# Patient Record
Sex: Female | Born: 1950 | Race: White | Hispanic: No | Marital: Married | Smoking: Never smoker
Health system: Southern US, Community
[De-identification: ages and names within clinical notes are randomized; demographics above are authoritative.]

---

## 2003-02-04 ENCOUNTER — Other Ambulatory Visit: Admission: RE | Admit: 2003-02-04 | Discharge: 2003-02-04 | Payer: Self-pay | Admitting: Obstetrics & Gynecology

## 2004-04-03 ENCOUNTER — Encounter: Admission: RE | Admit: 2004-04-03 | Discharge: 2004-04-03 | Payer: Self-pay | Admitting: Orthopaedic Surgery

## 2004-06-07 ENCOUNTER — Ambulatory Visit (HOSPITAL_COMMUNITY): Admission: RE | Admit: 2004-06-07 | Discharge: 2004-06-07 | Payer: Self-pay | Admitting: Obstetrics & Gynecology

## 2004-06-07 ENCOUNTER — Encounter (INDEPENDENT_AMBULATORY_CARE_PROVIDER_SITE_OTHER): Payer: Self-pay | Admitting: Specialist

## 2008-08-25 ENCOUNTER — Encounter: Admission: RE | Admit: 2008-08-25 | Discharge: 2008-10-27 | Payer: Self-pay | Admitting: Family Medicine

## 2008-11-24 ENCOUNTER — Encounter: Admission: RE | Admit: 2008-11-24 | Discharge: 2008-11-24 | Payer: Self-pay | Admitting: Family Medicine

## 2010-04-20 IMAGING — CT CT PELVIS W/O CM
3 series · 14 of 36 positions shown, 20 images · non-contrast
Comparison: None

CLINICAL DATA: Right inguinal pain, evaluate for hernia

CT PELVIS WITHOUT CONTRAST
TECHNIQUE: Multidetector CT imaging of the pelvis was performed
following the standard protocol without intravenous contrast.

[Series 3: routine pelvis · axial · 0.70mm/px · z∈[-178,-54]mm · 5 of 39 slices shown, 10 images]
[im 7/39  soft-tissue]
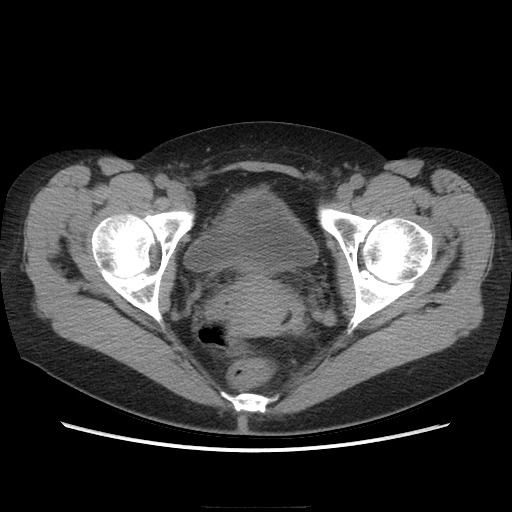
[im 7/39  bone]
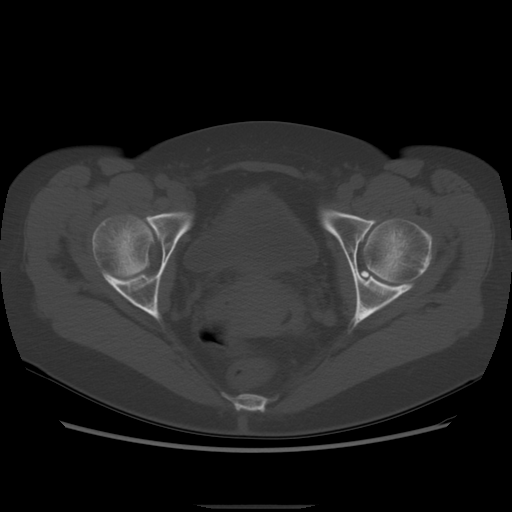
[im 13/39  soft-tissue]
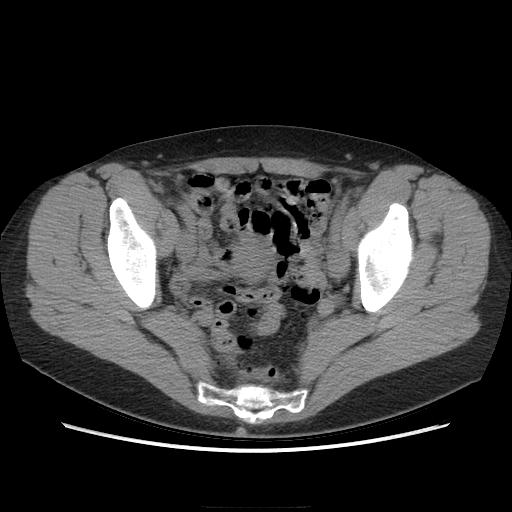
[im 13/39  lung]
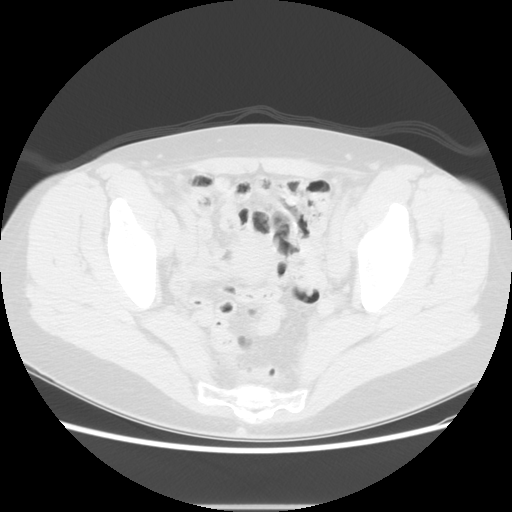
[im 20/39  soft-tissue]
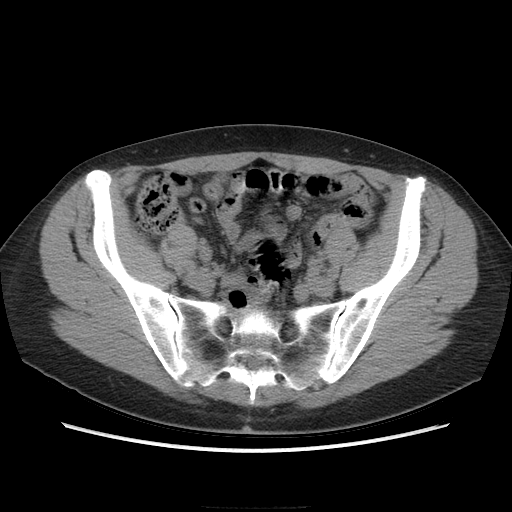
[im 20/39  lung]
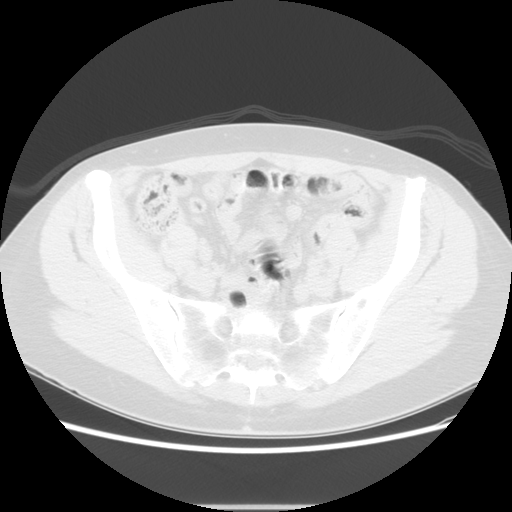
[im 26/39  soft-tissue]
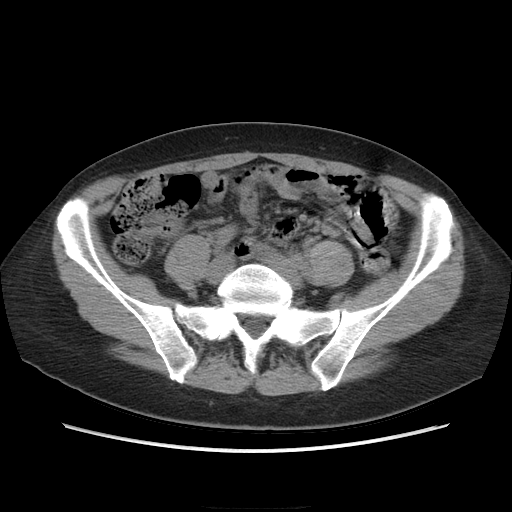
[im 26/39  lung]
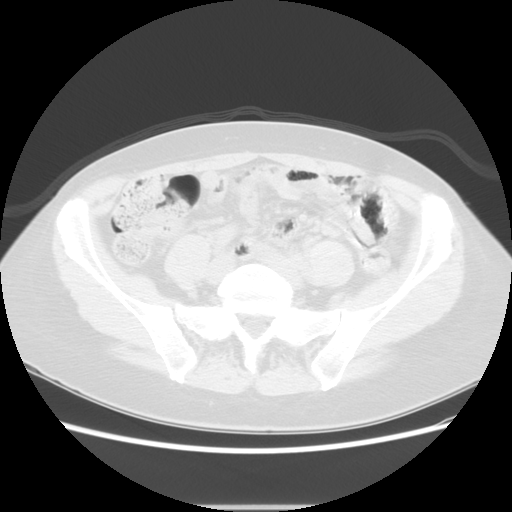
[im 32/39  soft-tissue]
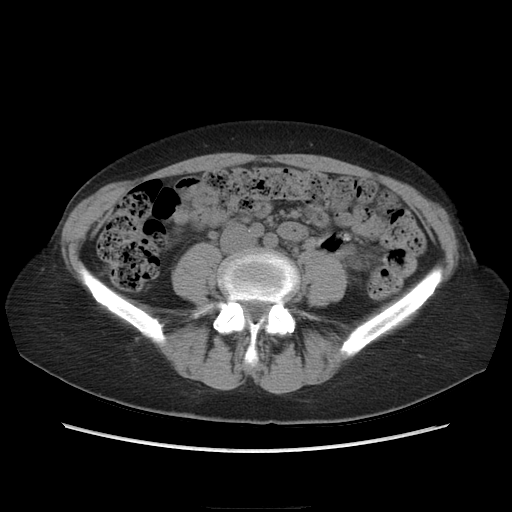
[im 32/39  lung]
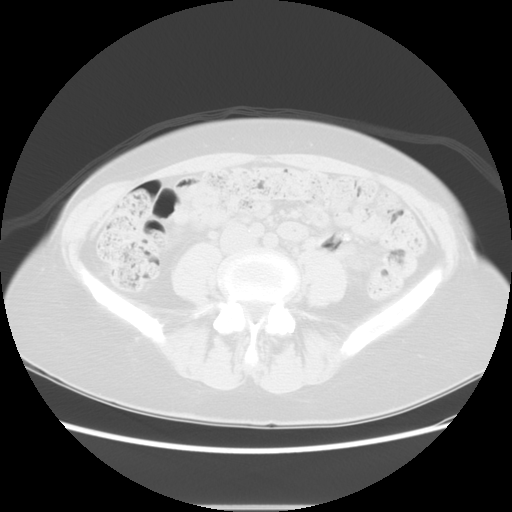

[Series 601: coronal body · coronal · 0.70mm/px · 1 of 113 slices shown, 2 images]
[im 38/113  soft-tissue]
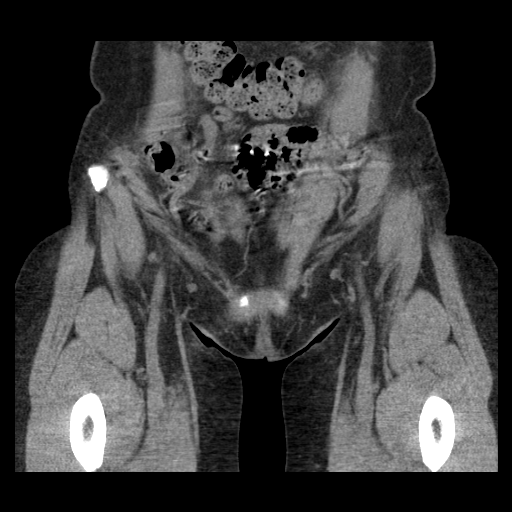
[im 38/113  bone]
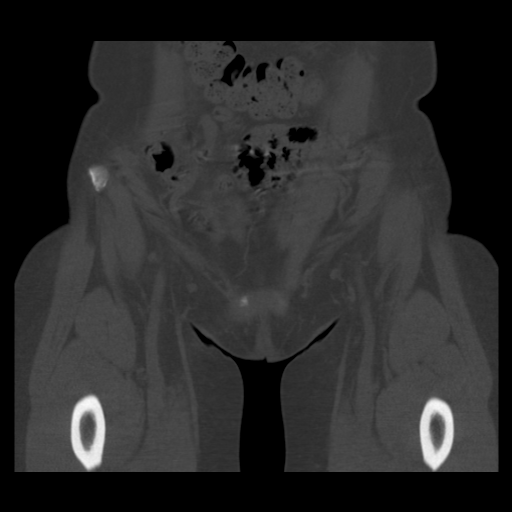

[Series 602: sagittal body · sagittal · 0.70mm/px · 8 of 145 slices shown]
[im 13/145  soft-tissue]
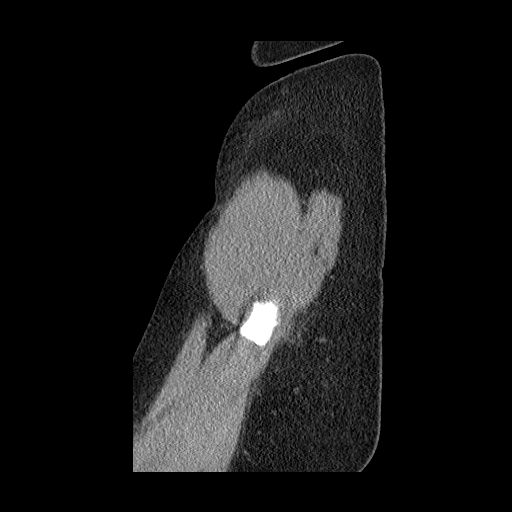
[im 31/145  soft-tissue]
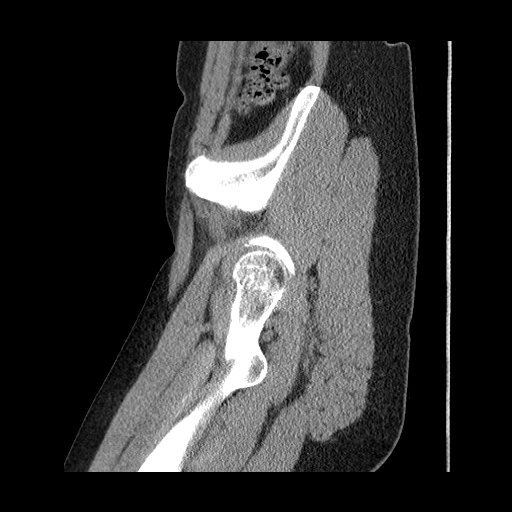
[im 49/145  soft-tissue]
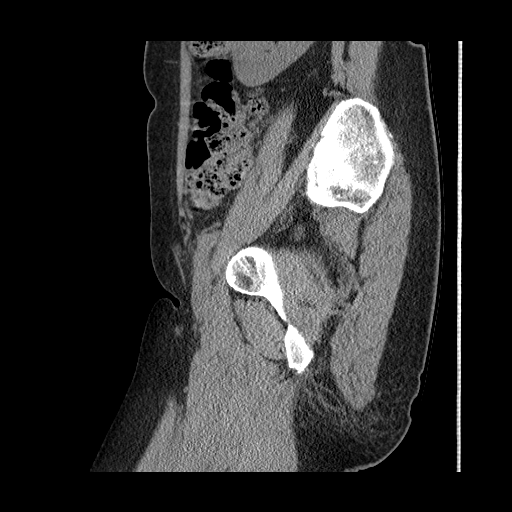
[im 67/145  soft-tissue]
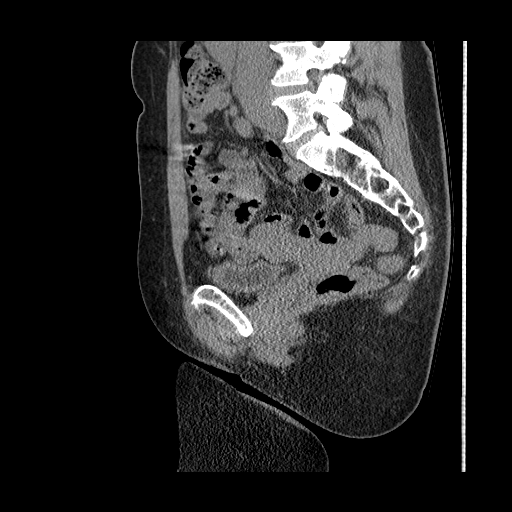
[im 79/145  soft-tissue]
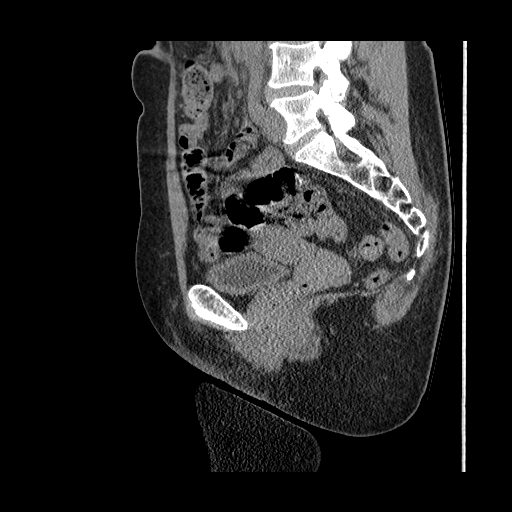
[im 97/145  soft-tissue]
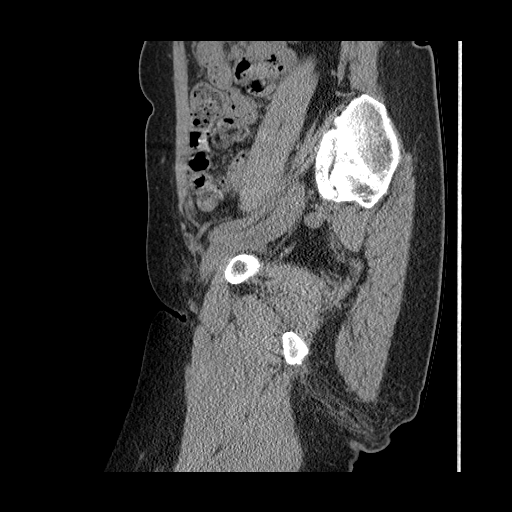
[im 115/145  soft-tissue]
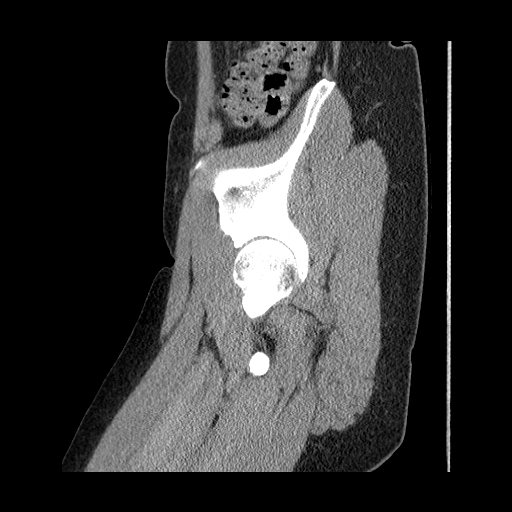
[im 133/145  soft-tissue]
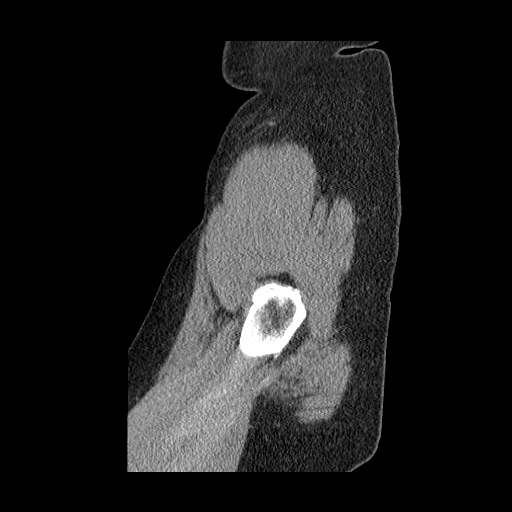

[14 of 36 positions shown; findings below may reference images not displayed]

FINDINGS: On this unenhanced study, there is no evidence of
inguinal hernia.  The urinary bladder is not well distended but is
unremarkable.  The uterus appears normal in size.  No adnexal
lesion is seen and no fluid is noted within the pelvis.  The
terminal ileum appears normal. The appendix appears normal.
IMPRESSION: No evidence of hernia is seen.  No pelvic mass, fluid, or
adenopathy is noted.

## 2010-12-01 NOTE — Op Note (Signed)
NAMELAYKEN, BEG               ACCOUNT NO.:  [id number]   MEDICAL RECORD NO.:  [id number]          PATIENT TYPE:  AMB   LOCATION:  [facility]                           FACILITY:  [facility]   PHYSICIAN:  Genia Del, M.D.DATE OF BIRTH:  [birth year]   DATE OF PROCEDURE:  06/07/2004  DATE OF DISCHARGE:                                 OPERATIVE REPORT   PREOPERATIVE DIAGNOSES:  Perimenopausal irregular bleeding with intrauterine  lesion.   POSTOPERATIVE DIAGNOSES:  Perimenopausal irregular bleeding with  intrauterine lesion.   INTERVENTION:  Hysteroscopy with resection and dilatation and curettage.   SURGEON:  Genia Del, M.D.   ANESTHESIOLOGIST:  Octaviano Glow. Pamalee Leyden, M.D.   DESCRIPTION OF PROCEDURE:  Under MAC analgesia, the patient is in lithotomy  position, she is prepped in the suprapubic, vulvar and vaginal areas with  Betadine and draped as usual. The vaginal exam reveals an anteverted uterus,  no adnexal mass. The speculum is inserted, the anterior lip of the cervix is  grasped with a tenaculum, a paracervical block is done with Nesacaine 1%  with bicarb, 20 mL total at 4 o'clock and 8 o'clock.  We then proceed with  dilatation with Hegar dilators up to 20 easily. We do a diagnostic  hysteroscopy which reveals and anterior wall lesion about 1 cm compatible  with an endometrial polyp. No other lesion is seen. The diagnostic  hysteroscope is removed. We complete dilatation at Hegar dilator #35 easily.  We then insert the operative hysteroscope. The intrauterine cavity is  inspected again.  The intrauterine lesion is visualized and resected with  the double loop. It is sent to pathology. No other lesion is present. A  picture is taken of the intrauterine lesion and then another picture is  taken after resection.  Hemostasis is completed with coagulation with the  loop. We then remove the hysteroscope, proceed with a curettage of the  intrauterine cavity on all surfaces with a  sharp curette. The endometrial  curettings are sent to pathology.  Hemostasis is adequate. We therefore  remove all instruments inside the vagina. The estimated blood loss was less  than 50 mL.  No complication occurred and the patient was transferred to the  recovery room in good status.      ML/MEDQ  D:  06/07/2004  T:  06/07/2004  Job:  [id number]

## 2011-03-22 ENCOUNTER — Other Ambulatory Visit: Payer: Self-pay | Admitting: Family Medicine

## 2011-03-22 ENCOUNTER — Ambulatory Visit
Admission: RE | Admit: 2011-03-22 | Discharge: 2011-03-22 | Disposition: A | Discharge status: Home / Self Care | Payer: BC Managed Care – PPO | Source: Ambulatory Visit | Attending: Family Medicine | Admitting: Family Medicine

## 2011-03-22 DIAGNOSIS — M79671 Pain in right foot: Secondary | ICD-10-CM

## 2012-08-15 IMAGING — CR DG FOOT COMPLETE 3+V*R*
3 series · 3 of 3 positions shown · non-contrast
Comparison: None.

CLINICAL DATA: Dropped weight on foot yesterday with pain

RIGHT FOOT COMPLETE - 3+ VIEW

[view not recorded (1 of 3)]
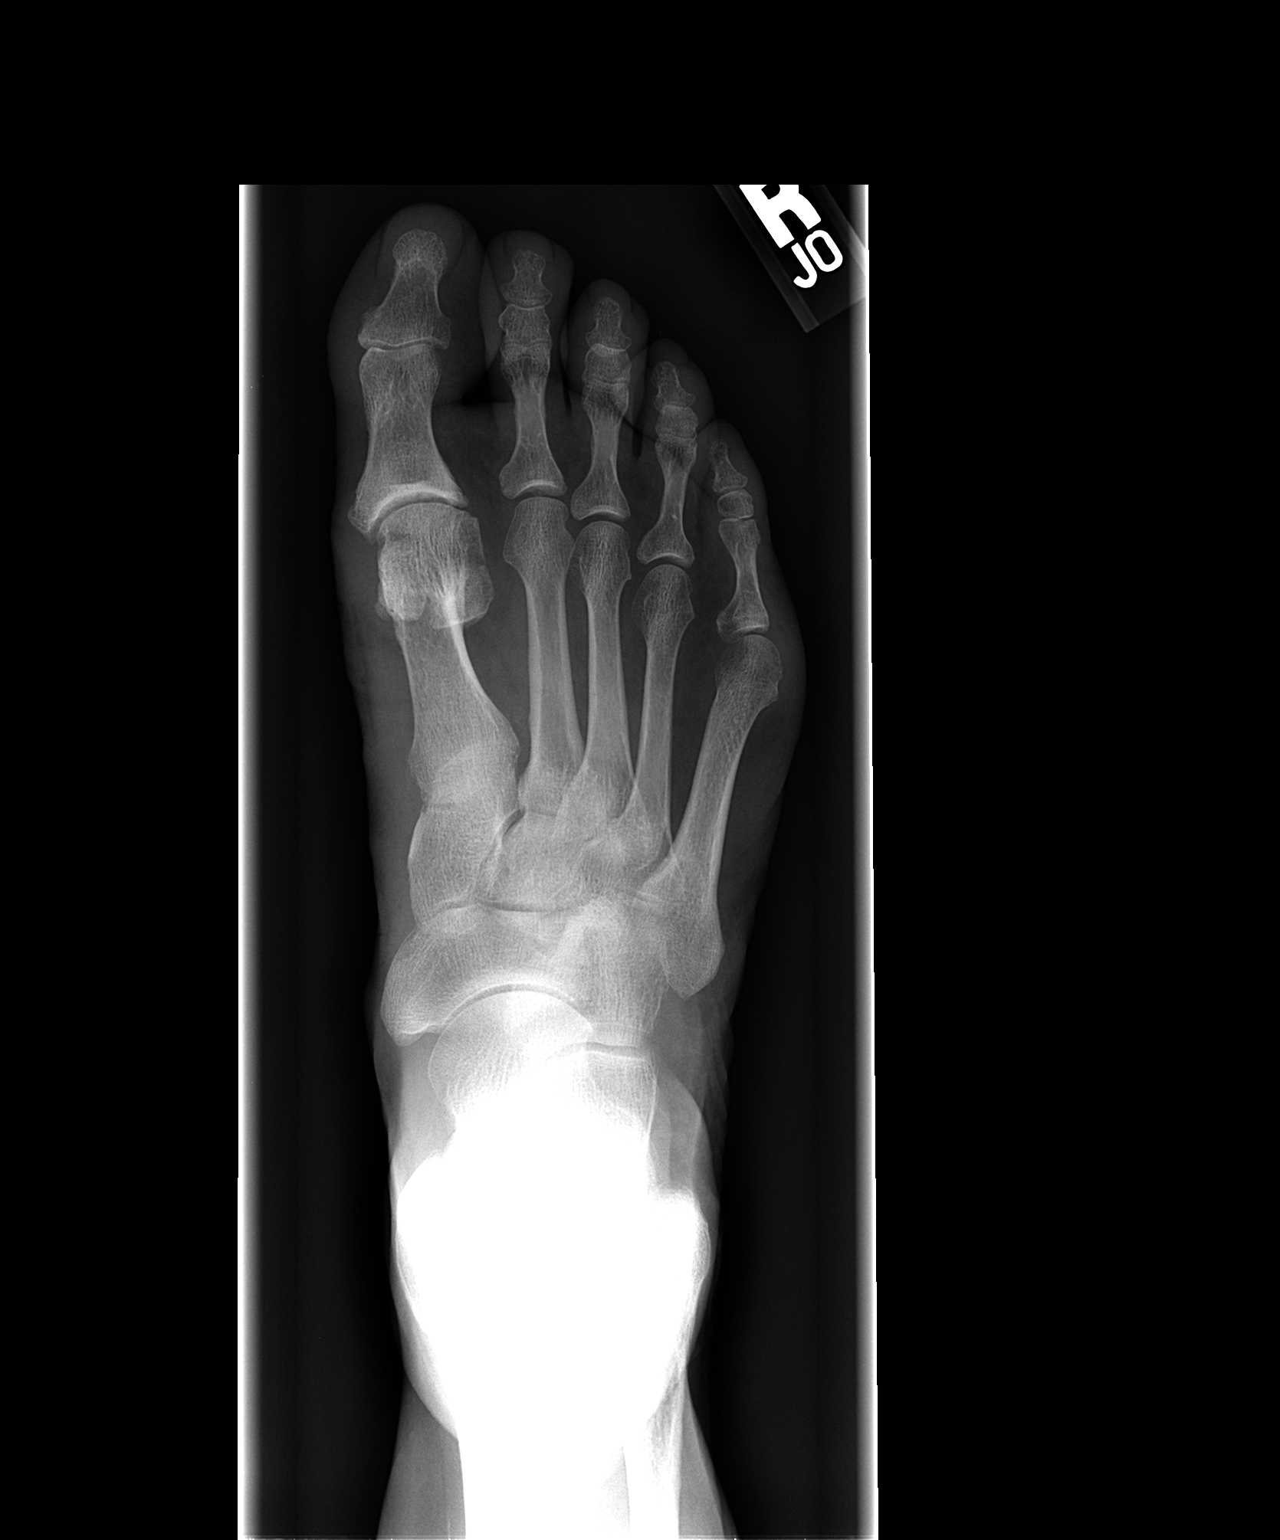

[view not recorded (2 of 3)]
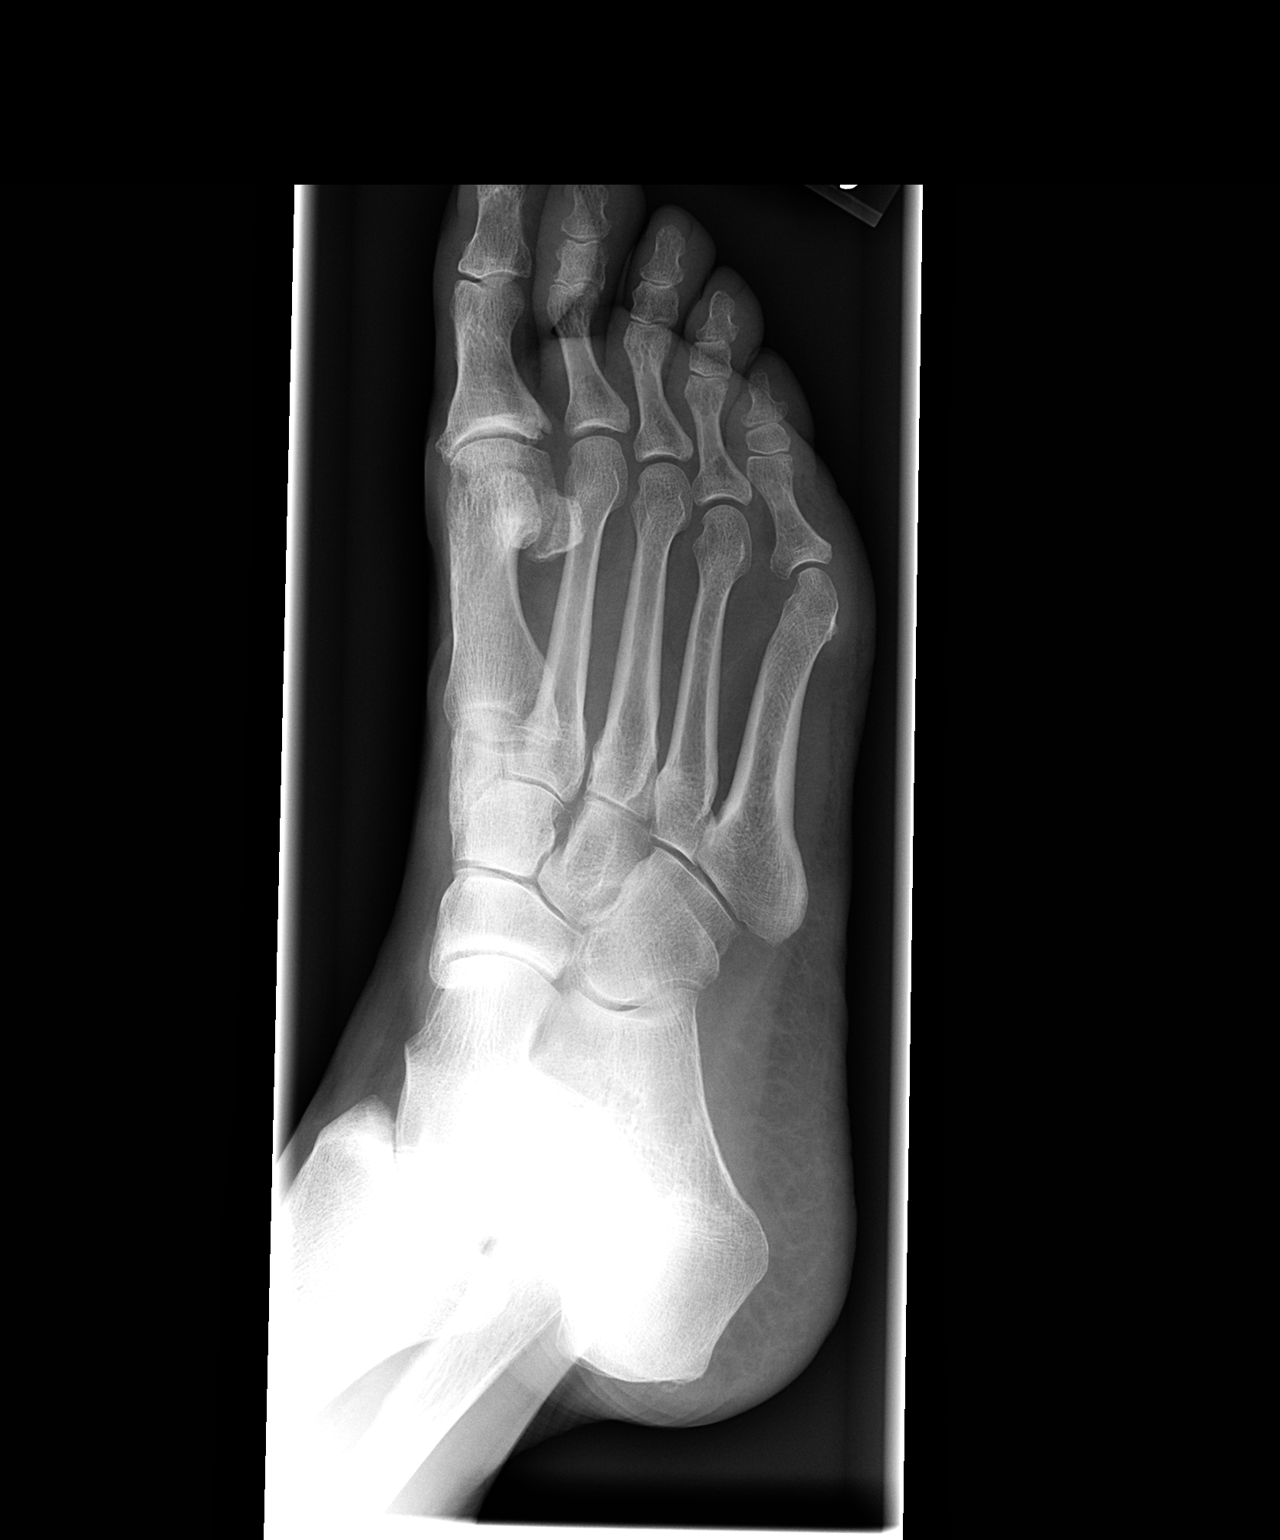

[view not recorded (3 of 3)]
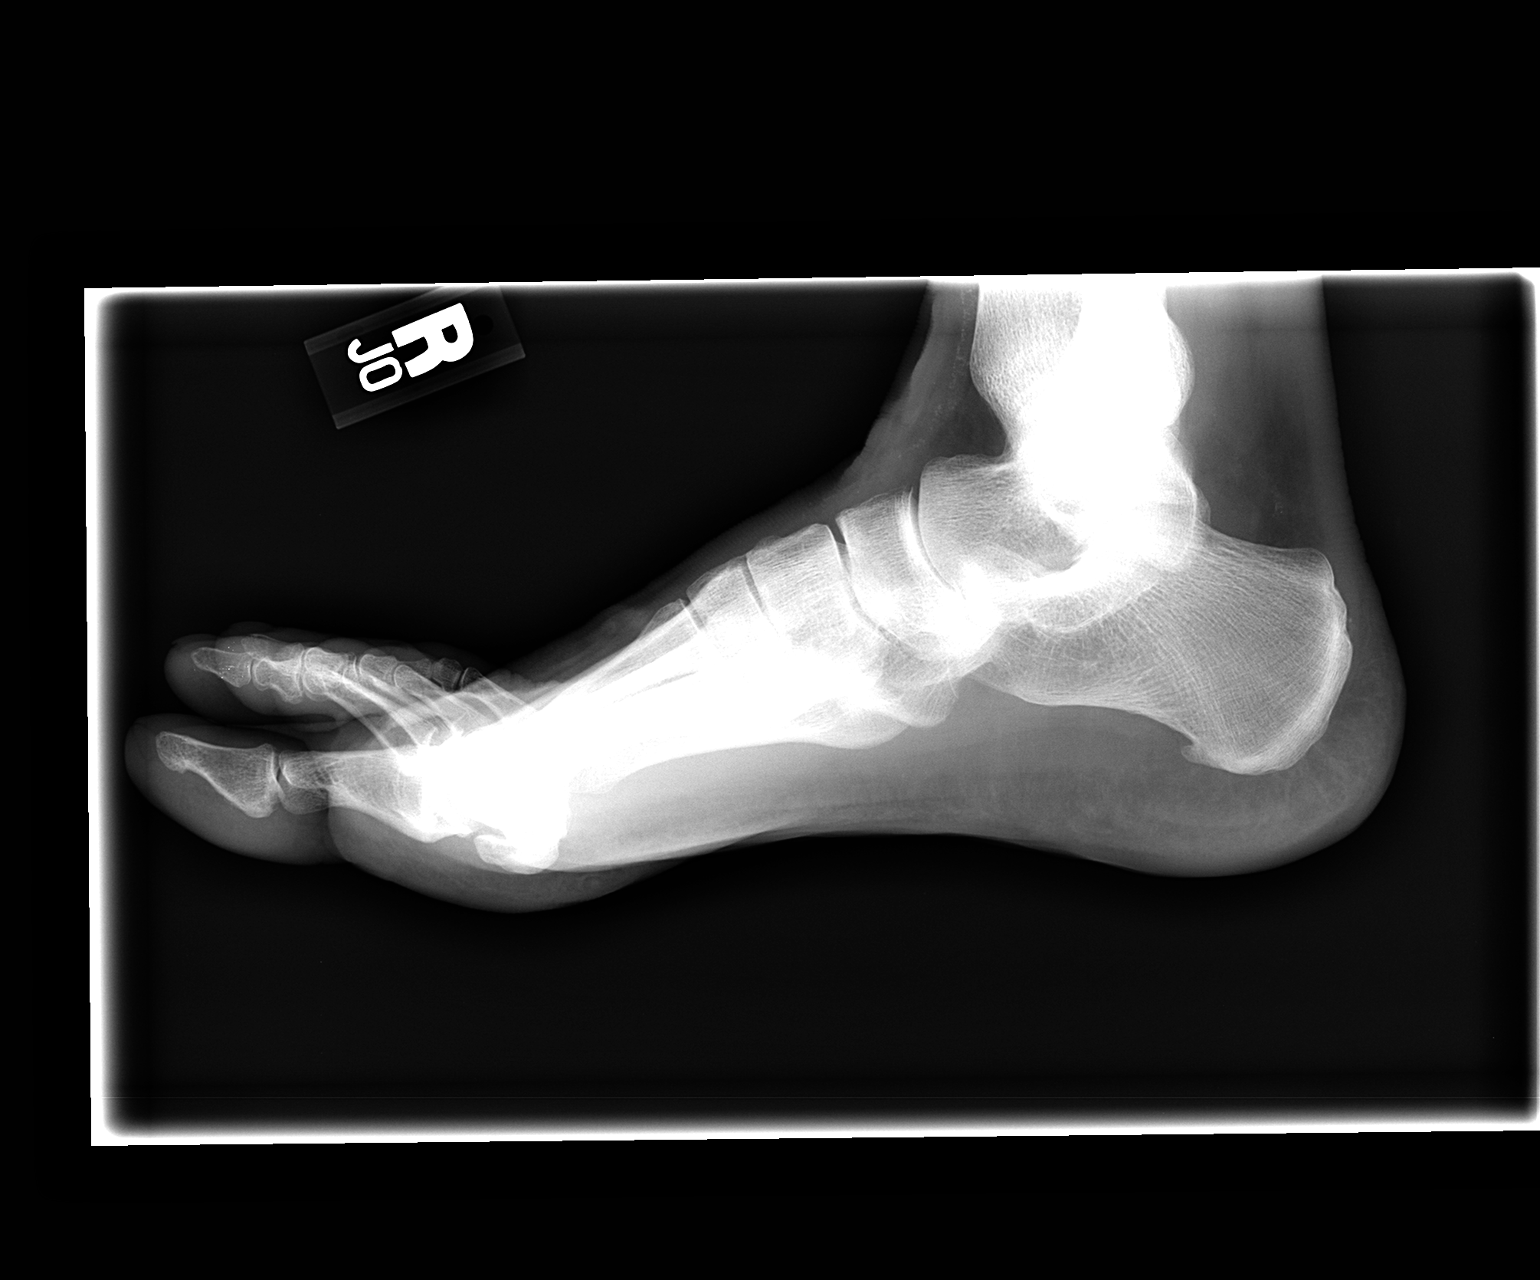

[3 of 3 positions shown; findings below may reference images not displayed]

FINDINGS: There is degenerative change at the right first MTP joint
with some loss of joint space, sclerosis, and spurring.  However,
no acute fracture is seen.  Tarsal - metatarsal alignment is normal
and otherwise joint spaces appear normal.
IMPRESSION: No acute fracture.  Degenerative change at the right first MTP
joint.

## 2016-02-24 ENCOUNTER — Ambulatory Visit (INDEPENDENT_AMBULATORY_CARE_PROVIDER_SITE_OTHER): Payer: PPO

## 2016-02-24 ENCOUNTER — Encounter: Payer: Self-pay | Admitting: Podiatry

## 2016-02-24 ENCOUNTER — Ambulatory Visit (INDEPENDENT_AMBULATORY_CARE_PROVIDER_SITE_OTHER): Payer: PPO | Admitting: Podiatry

## 2016-02-24 VITALS — BP 137/79 | HR 72 | Resp 16 | Ht 64.5 in | Wt 155.0 lb

## 2016-02-24 DIAGNOSIS — M779 Enthesopathy, unspecified: Secondary | ICD-10-CM

## 2016-02-24 DIAGNOSIS — M25474 Effusion, right foot: Secondary | ICD-10-CM

## 2016-02-24 DIAGNOSIS — M79671 Pain in right foot: Secondary | ICD-10-CM

## 2016-02-24 MED ORDER — TRIAMCINOLONE ACETONIDE 10 MG/ML IJ SUSP
10.0000 mg | Freq: Once | INTRAMUSCULAR | Status: AC
  Administered 2016-02-24: 10 mg

## 2016-02-24 NOTE — Progress Notes (Signed)
   Subjective:    Patient ID: Megan Benitez, female    DOB: 26-Sep-1950, 65 y.o.   MRN: [id number]  HPI Chief Complaint  Patient presents with  . Foot Pain    Right foot-lateral side; swelling; x2-3 months      Review of Systems  All other systems reviewed and are negative.      Objective:   Physical Exam        Assessment & Plan:

## 2016-02-26 NOTE — Progress Notes (Signed)
Subjective:     Patient ID: Megan Benitez, female   DOB: Apr 17, 1951, 65 y.o.   MRN: [id number]  HPI patient states she's having a lot of pain on the right lateral side that is making it difficult to walk comfortably and it is becoming gradually more intense over the last few months and making it hard to be comfortable   Review of Systems  All other systems reviewed and are negative.      Objective:   Physical Exam  Constitutional: She is oriented to person, place, and time.  Cardiovascular: Intact distal pulses.   Musculoskeletal: Normal range of motion.  Neurological: She is oriented to person, place, and time.  Skin: Skin is warm.  Nursing note and vitals reviewed.  neurovascular status intact muscle strength adequate range of motion within normal limits with patient found to have quite a bit of inflammation around the base of the fifth metatarsal right with fluid buildup noted but no indications of peroneal dysfunction. It is quite tender and she is walking with a moderate limp. Noted to have good digital perfusion and well oriented 3     Assessment:     Inflammatory peroneal tendinitis right with fluid buildup    Plan:     H&P x-rays reviewed and careful sheath injection administered and applied fascial brace to lift up lateral side of the foot along with aggressive ice. Reappoint to check in the next several weeks

## 2016-03-09 ENCOUNTER — Ambulatory Visit: Payer: PPO | Admitting: Podiatry
